# Patient Record
Sex: Female | Born: 1960 | Race: Black or African American | Hispanic: No | Marital: Single | Smoking: Never smoker
Health system: Southern US, Community
[De-identification: ages and names within clinical notes are randomized; demographics above are authoritative.]

## PROBLEM LIST (undated history)

## (undated) DIAGNOSIS — N63 Unspecified lump in unspecified breast: Secondary | ICD-10-CM

## (undated) DIAGNOSIS — A048 Other specified bacterial intestinal infections: Secondary | ICD-10-CM

## (undated) DIAGNOSIS — R87610 Atypical squamous cells of undetermined significance on cytologic smear of cervix (ASC-US): Secondary | ICD-10-CM

## (undated) DIAGNOSIS — E0590 Thyrotoxicosis, unspecified without thyrotoxic crisis or storm: Secondary | ICD-10-CM

## (undated) DIAGNOSIS — K219 Gastro-esophageal reflux disease without esophagitis: Secondary | ICD-10-CM

## (undated) DIAGNOSIS — E7800 Pure hypercholesterolemia, unspecified: Secondary | ICD-10-CM

## (undated) DIAGNOSIS — H348122 Central retinal vein occlusion, left eye, stable: Secondary | ICD-10-CM

## (undated) DIAGNOSIS — D649 Anemia, unspecified: Secondary | ICD-10-CM

## (undated) DIAGNOSIS — N951 Menopausal and female climacteric states: Secondary | ICD-10-CM

## (undated) DIAGNOSIS — E0500 Thyrotoxicosis with diffuse goiter without thyrotoxic crisis or storm: Secondary | ICD-10-CM

## (undated) HISTORY — PX: OTHER SURGICAL HISTORY: SHX169

## (undated) HISTORY — DX: Thyrotoxicosis, unspecified without thyrotoxic crisis or storm: E05.90

## (undated) HISTORY — DX: Thyrotoxicosis with diffuse goiter without thyrotoxic crisis or storm: E05.00

## (undated) HISTORY — DX: Central retinal vein occlusion, left eye, stable: H34.8122

## (undated) HISTORY — DX: Pure hypercholesterolemia, unspecified: E78.00

## (undated) HISTORY — DX: Other specified bacterial intestinal infections: A04.8

## (undated) HISTORY — DX: Anemia, unspecified: D64.9

## (undated) HISTORY — DX: Atypical squamous cells of undetermined significance on cytologic smear of cervix (ASC-US): R87.610

## (undated) HISTORY — DX: Menopausal and female climacteric states: N95.1

## (undated) HISTORY — DX: Unspecified lump in unspecified breast: N63.0

## (undated) HISTORY — DX: Gastro-esophageal reflux disease without esophagitis: K21.9

---

## 2000-01-13 ENCOUNTER — Encounter: Payer: Self-pay | Admitting: Family Medicine

## 2000-01-13 ENCOUNTER — Encounter: Admission: RE | Admit: 2000-01-13 | Discharge: 2000-01-13 | Payer: Self-pay | Admitting: Family Medicine

## 2001-09-20 ENCOUNTER — Other Ambulatory Visit: Admission: RE | Admit: 2001-09-20 | Discharge: 2001-09-20 | Payer: Self-pay | Admitting: Family Medicine

## 2002-01-31 ENCOUNTER — Encounter: Payer: Self-pay | Admitting: Family Medicine

## 2002-01-31 ENCOUNTER — Encounter: Admission: RE | Admit: 2002-01-31 | Discharge: 2002-01-31 | Payer: Self-pay | Admitting: Family Medicine

## 2003-01-13 ENCOUNTER — Encounter: Payer: Self-pay | Admitting: Family Medicine

## 2003-01-13 ENCOUNTER — Encounter: Admission: RE | Admit: 2003-01-13 | Discharge: 2003-01-13 | Payer: Self-pay | Admitting: Family Medicine

## 2003-07-10 ENCOUNTER — Encounter: Admission: RE | Admit: 2003-07-10 | Discharge: 2003-07-10 | Payer: Self-pay | Admitting: Family Medicine

## 2004-11-25 ENCOUNTER — Encounter: Admission: RE | Admit: 2004-11-25 | Discharge: 2004-11-25 | Payer: Self-pay | Admitting: Family Medicine

## 2005-01-24 ENCOUNTER — Other Ambulatory Visit: Admission: RE | Admit: 2005-01-24 | Discharge: 2005-01-24 | Payer: Self-pay | Admitting: Family Medicine

## 2007-12-19 ENCOUNTER — Other Ambulatory Visit: Admission: RE | Admit: 2007-12-19 | Discharge: 2007-12-19 | Payer: Self-pay | Admitting: Family Medicine

## 2009-02-05 ENCOUNTER — Ambulatory Visit (HOSPITAL_COMMUNITY): Admission: RE | Admit: 2009-02-05 | Discharge: 2009-02-05 | Payer: Self-pay | Admitting: Family Medicine

## 2009-06-16 ENCOUNTER — Other Ambulatory Visit: Admission: RE | Admit: 2009-06-16 | Discharge: 2009-06-16 | Payer: Self-pay | Admitting: Family Medicine

## 2009-06-23 ENCOUNTER — Encounter: Admission: RE | Admit: 2009-06-23 | Discharge: 2009-06-23 | Payer: Self-pay | Admitting: Family Medicine

## 2010-03-01 ENCOUNTER — Encounter: Admission: RE | Admit: 2010-03-01 | Discharge: 2010-03-01 | Payer: Self-pay | Admitting: Family Medicine

## 2010-04-17 ENCOUNTER — Encounter: Payer: Self-pay | Admitting: Family Medicine

## 2010-06-28 ENCOUNTER — Other Ambulatory Visit (HOSPITAL_COMMUNITY)
Admission: RE | Admit: 2010-06-28 | Discharge: 2010-06-28 | Disposition: A | Discharge status: Home / Self Care | Payer: BC Managed Care – PPO | Source: Ambulatory Visit | Attending: Family Medicine | Admitting: Family Medicine

## 2010-06-28 ENCOUNTER — Other Ambulatory Visit: Payer: Self-pay | Admitting: Family Medicine

## 2010-06-28 DIAGNOSIS — Z124 Encounter for screening for malignant neoplasm of cervix: Secondary | ICD-10-CM | POA: Insufficient documentation

## 2011-01-24 ENCOUNTER — Other Ambulatory Visit: Payer: Self-pay | Admitting: Family Medicine

## 2011-01-24 DIAGNOSIS — N6009 Solitary cyst of unspecified breast: Secondary | ICD-10-CM

## 2011-02-04 ENCOUNTER — Ambulatory Visit
Admission: RE | Admit: 2011-02-04 | Discharge: 2011-02-04 | Disposition: A | Discharge status: Home / Self Care | Payer: BC Managed Care – PPO | Source: Ambulatory Visit | Attending: Family Medicine | Admitting: Family Medicine

## 2011-02-04 ENCOUNTER — Other Ambulatory Visit: Payer: Self-pay | Admitting: Family Medicine

## 2011-02-04 DIAGNOSIS — N6009 Solitary cyst of unspecified breast: Secondary | ICD-10-CM

## 2011-03-04 ENCOUNTER — Other Ambulatory Visit: Payer: Self-pay | Admitting: Family Medicine

## 2011-03-04 ENCOUNTER — Ambulatory Visit
Admission: RE | Admit: 2011-03-04 | Discharge: 2011-03-04 | Disposition: A | Discharge status: Home / Self Care | Payer: BC Managed Care – PPO | Source: Ambulatory Visit | Attending: Family Medicine | Admitting: Family Medicine

## 2011-03-04 DIAGNOSIS — N6009 Solitary cyst of unspecified breast: Secondary | ICD-10-CM

## 2011-03-07 ENCOUNTER — Ambulatory Visit
Admission: RE | Admit: 2011-03-07 | Discharge: 2011-03-07 | Disposition: A | Discharge status: Home / Self Care | Payer: BC Managed Care – PPO | Source: Ambulatory Visit | Attending: Family Medicine | Admitting: Family Medicine

## 2011-03-07 DIAGNOSIS — N6009 Solitary cyst of unspecified breast: Secondary | ICD-10-CM

## 2012-02-01 ENCOUNTER — Other Ambulatory Visit: Payer: Self-pay | Admitting: Family Medicine

## 2012-02-01 DIAGNOSIS — Z1231 Encounter for screening mammogram for malignant neoplasm of breast: Secondary | ICD-10-CM

## 2012-03-12 ENCOUNTER — Ambulatory Visit
Admission: RE | Admit: 2012-03-12 | Discharge: 2012-03-12 | Disposition: A | Discharge status: Home / Self Care | Payer: BC Managed Care – PPO | Source: Ambulatory Visit | Attending: Family Medicine | Admitting: Family Medicine

## 2012-03-12 ENCOUNTER — Ambulatory Visit: Payer: BC Managed Care – PPO

## 2012-03-12 DIAGNOSIS — Z1231 Encounter for screening mammogram for malignant neoplasm of breast: Secondary | ICD-10-CM

## 2012-03-16 ENCOUNTER — Other Ambulatory Visit: Payer: Self-pay | Admitting: Family Medicine

## 2012-03-16 DIAGNOSIS — R928 Other abnormal and inconclusive findings on diagnostic imaging of breast: Secondary | ICD-10-CM

## 2012-03-30 ENCOUNTER — Ambulatory Visit
Admission: RE | Admit: 2012-03-30 | Discharge: 2012-03-30 | Disposition: A | Discharge status: Home / Self Care | Payer: BC Managed Care – PPO | Source: Ambulatory Visit | Attending: Family Medicine | Admitting: Family Medicine

## 2012-03-30 DIAGNOSIS — R928 Other abnormal and inconclusive findings on diagnostic imaging of breast: Secondary | ICD-10-CM

## 2013-01-24 ENCOUNTER — Other Ambulatory Visit: Payer: Self-pay

## 2013-01-24 DIAGNOSIS — Z1231 Encounter for screening mammogram for malignant neoplasm of breast: Secondary | ICD-10-CM

## 2013-03-13 ENCOUNTER — Ambulatory Visit
Admission: RE | Admit: 2013-03-13 | Discharge: 2013-03-13 | Disposition: A | Discharge status: Home / Self Care | Payer: BC Managed Care – PPO | Source: Ambulatory Visit

## 2013-03-13 DIAGNOSIS — Z1231 Encounter for screening mammogram for malignant neoplasm of breast: Secondary | ICD-10-CM

## 2013-03-20 ENCOUNTER — Other Ambulatory Visit: Payer: Self-pay | Admitting: Family Medicine

## 2013-03-20 DIAGNOSIS — R928 Other abnormal and inconclusive findings on diagnostic imaging of breast: Secondary | ICD-10-CM

## 2013-04-02 ENCOUNTER — Ambulatory Visit
Admission: RE | Admit: 2013-04-02 | Discharge: 2013-04-02 | Disposition: A | Discharge status: Home / Self Care | Payer: BC Managed Care – PPO | Source: Ambulatory Visit | Attending: Family Medicine | Admitting: Family Medicine

## 2013-04-02 DIAGNOSIS — R928 Other abnormal and inconclusive findings on diagnostic imaging of breast: Secondary | ICD-10-CM

## 2013-04-15 ENCOUNTER — Encounter (HOSPITAL_COMMUNITY): Payer: Self-pay | Admitting: Emergency Medicine

## 2013-04-15 ENCOUNTER — Emergency Department (HOSPITAL_COMMUNITY)
Admission: EM | Admit: 2013-04-15 | Discharge: 2013-04-15 | Disposition: A | Discharge status: Home / Self Care | Payer: BC Managed Care – PPO | Attending: Emergency Medicine | Admitting: Emergency Medicine

## 2013-04-15 DIAGNOSIS — Y9389 Activity, other specified: Secondary | ICD-10-CM | POA: Insufficient documentation

## 2013-04-15 DIAGNOSIS — S161XXA Strain of muscle, fascia and tendon at neck level, initial encounter: Secondary | ICD-10-CM

## 2013-04-15 DIAGNOSIS — Y92410 Unspecified street and highway as the place of occurrence of the external cause: Secondary | ICD-10-CM | POA: Insufficient documentation

## 2013-04-15 DIAGNOSIS — S139XXA Sprain of joints and ligaments of unspecified parts of neck, initial encounter: Secondary | ICD-10-CM | POA: Insufficient documentation
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
## 2013-04-15 NOTE — Discharge Instructions (Signed)
Cervical Strain and Sprain (Whiplash)  with Rehab  Cervical strain and sprains are injuries that commonly occur with "whiplash" injuries. Whiplash occurs when the neck is forcefully whipped backward or forward, such as during a motor vehicle accident. The muscles, ligaments, tendons, discs and nerves of the neck are susceptible to injury when this occurs.  SYMPTOMS   · Pain or stiffness in the front and/or back of neck  · Symptoms may present immediately or up to 24 hours after injury.  · Dizziness, headache, nausea and vomiting.  · Muscle spasm with soreness and stiffness in the neck.  · Tenderness and swelling at the injury site.  CAUSES   Whiplash injuries often occur during contact sports or motor vehicle accidents.   RISK INCREASES WITH:  · Osteoarthritis of the spine.  · Situations that make head or neck accidents or trauma more likely.  · High-risk sports (football, rugby, wrestling, hockey, auto racing, gymnastics, diving, contact karate or boxing).  · Poor strength and flexibility of the neck.  · Previous neck injury.  · Poor tackling technique.  · Improperly fitted or padded equipment.  PREVENTION  · Learn and use proper technique (avoid tackling with the head, spearing and head-butting; use proper falling techniques to avoid landing on the head).  · Warm up and stretch properly before activity.  · Maintain physical fitness:  · Strength, flexibility and endurance.  · Cardiovascular fitness.  · Wear properly fitted and padded protective equipment, such as padded soft collars, for participation in contact sports.  PROGNOSIS   Recovery for cervical strain and sprain injuries is dependent on the extent of the injury. These injuries are usually curable in 1 week to 3 months with appropriate treatment.   RELATED COMPLICATIONS   · Temporary numbness and weakness may occur if the nerve roots are damaged, and this may persist until the nerve has completely healed.  · Chronic pain due to frequent recurrence of  symptoms.  · Prolonged healing, especially if activity is resumed too soon (before complete recovery).  TREATMENT   Treatment initially involves the use of ice and medication to help reduce pain and inflammation. It is also important to perform strengthening and stretching exercises and modify activities that worsen symptoms so the injury does not get worse. These exercises may be performed at home or with a therapist. For patients who experience severe symptoms, a soft padded collar may be recommended to be worn around the neck.   Improving your posture may help reduce symptoms. Posture improvement includes pulling your chin and abdomen in while sitting or standing. If you are sitting, sit in a firm chair with your buttocks against the back of the chair. While sleeping, try replacing your pillow with a small towel rolled to 2 inches in diameter, or use a cervical pillow or soft cervical collar. Poor sleeping positions delay healing.   For patients with nerve root damage, which causes numbness or weakness, the use of a cervical traction apparatus may be recommended. Surgery is rarely necessary for these injuries. However, cervical strain and sprains that are present at birth (congenital) may require surgery.  MEDICATION   · If pain medication is necessary, nonsteroidal anti-inflammatory medications, such as aspirin and ibuprofen, or other minor pain relievers, such as acetaminophen, are often recommended.  · Do not take pain medication for 7 days before surgery.  · Prescription pain relievers may be given if deemed necessary by your caregiver. Use only as directed and only as much as you   need.  HEAT AND COLD:   · Cold treatment (icing) relieves pain and reduces inflammation. Cold treatment should be applied for 10 to 15 minutes every 2 to 3 hours for inflammation and pain and immediately after any activity that aggravates your symptoms. Use ice packs or an ice massage.  · Heat treatment may be used prior to  performing the stretching and strengthening activities prescribed by your caregiver, physical therapist, or athletic trainer. Use a heat pack or a warm soak.  SEEK MEDICAL CARE IF:   · Symptoms get worse or do not improve in 2 weeks despite treatment.  · New, unexplained symptoms develop (drugs used in treatment may produce side effects).  EXERCISES  RANGE OF MOTION (ROM) AND STRETCHING EXERCISES - Cervical Strain and Sprain  These exercises may help you when beginning to rehabilitate your injury. In order to successfully resolve your symptoms, you must improve your posture. These exercises are designed to help reduce the forward-head and rounded-shoulder posture which contributes to this condition. Your symptoms may resolve with or without further involvement from your physician, physical therapist or athletic trainer. While completing these exercises, remember:   · Restoring tissue flexibility helps normal motion to return to the joints. This allows healthier, less painful movement and activity.  · An effective stretch should be held for at least 20 seconds, although you may need to begin with shorter hold times for comfort.  · A stretch should never be painful. You should only feel a gentle lengthening or release in the stretched tissue.  STRETCH- Axial Extensors  · Lie on your back on the floor. You may bend your knees for comfort. Place a rolled up hand towel or dish towel, about 2 inches in diameter, under the part of your head that makes contact with the floor.  · Gently tuck your chin, as if trying to make a "double chin," until you feel a gentle stretch at the base of your head.  · Hold __________ seconds.  Repeat __________ times. Complete this exercise __________ times per day.   STRETECH - Axial Extension   · Stand or sit on a firm surface. Assume a good posture: chest up, shoulders drawn back, abdominal muscles slightly tense, knees unlocked (if standing) and feet hip width apart.  · Slowly retract your  chin so your head slides back and your chin slightly lowers.Continue to look straight ahead.  · You should feel a gentle stretch in the back of your head. Be certain not to feel an aggressive stretch since this can cause headaches later.  · Hold for __________ seconds.  Repeat __________ times. Complete this exercise __________ times per day.  STRETCH  Cervical Side Bend   · Stand or sit on a firm surface. Assume a good posture: chest up, shoulders drawn back, abdominal muscles slightly tense, knees unlocked (if standing) and feet hip width apart.  · Without letting your nose or shoulders move, slowly tip your right / left ear to your shoulder until your feel a gentle stretch in the muscles on the opposite side of your neck.  · Hold __________ seconds.  Repeat __________ times. Complete this exercise __________ times per day.  STRETCH  Cervical Rotators   · Stand or sit on a firm surface. Assume a good posture: chest up, shoulders drawn back, abdominal muscles slightly tense, knees unlocked (if standing) and feet hip width apart.  · Keeping your eyes level with the ground, slowly turn your head until you feel a gentle   stretch along the back and opposite side of your neck.  · Hold __________ seconds.  Repeat __________ times. Complete this exercise __________ times per day.  RANGE OF MOTION - Neck Circles   · Stand or sit on a firm surface. Assume a good posture: chest up, shoulders drawn back, abdominal muscles slightly tense, knees unlocked (if standing) and feet hip width apart.  · Gently roll your head down and around from the back of one shoulder to the back of the other. The motion should never be forced or painful.  · Repeat the motion 10-20 times, or until you feel the neck muscles relax and loosen.  Repeat __________ times. Complete the exercise __________ times per day.  STRENGTHENING EXERCISES - Cervical Strain and Sprain  These exercises may help you when beginning to rehabilitate your injury. They may  resolve your symptoms with or without further involvement from your physician, physical therapist or athletic trainer. While completing these exercises, remember:   · Muscles can gain both the endurance and the strength needed for everyday activities through controlled exercises.  · Complete these exercises as instructed by your physician, physical therapist or athletic trainer. Progress the resistance and repetitions only as guided.  · You may experience muscle soreness or fatigue, but the pain or discomfort you are trying to eliminate should never worsen during these exercises. If this pain does worsen, stop and make certain you are following the directions exactly. If the pain is still present after adjustments, discontinue the exercise until you can discuss the trouble with your clinician.  STRENGTH Cervical Flexors, Isometric  · Face a wall, standing about 6 inches away. Place a small pillow, a ball about 6-8 inches in diameter, or a folded towel between your forehead and the wall.  · Slightly tuck your chin and gently push your forehead into the soft object. Push only with mild to moderate intensity, building up tension gradually. Keep your jaw and forehead relaxed.  · Hold 10 to 20 seconds. Keep your breathing relaxed.  · Release the tension slowly. Relax your neck muscles completely before you start the next repetition.  Repeat __________ times. Complete this exercise __________ times per day.  STRENGTH- Cervical Lateral Flexors, Isometric   · Stand about 6 inches away from a wall. Place a small pillow, a ball about 6-8 inches in diameter, or a folded towel between the side of your head and the wall.  · Slightly tuck your chin and gently tilt your head into the soft object. Push only with mild to moderate intensity, building up tension gradually. Keep your jaw and forehead relaxed.  · Hold 10 to 20 seconds. Keep your breathing relaxed.  · Release the tension slowly. Relax your neck muscles completely before  you start the next repetition.  Repeat __________ times. Complete this exercise __________ times per day.  STRENGTH  Cervical Extensors, Isometric   · Stand about 6 inches away from a wall. Place a small pillow, a ball about 6-8 inches in diameter, or a folded towel between the back of your head and the wall.  · Slightly tuck your chin and gently tilt your head back into the soft object. Push only with mild to moderate intensity, building up tension gradually. Keep your jaw and forehead relaxed.  · Hold 10 to 20 seconds. Keep your breathing relaxed.  · Release the tension slowly. Relax your neck muscles completely before you start the next repetition.  Repeat __________ times. Complete this exercise __________ times per   day.  POSTURE AND BODY MECHANICS CONSIDERATIONS - Cervical Strain and Sprain  Keeping correct posture when sitting, standing or completing your activities will reduce the stress put on different body tissues, allowing injured tissues a chance to heal and limiting painful experiences. The following are general guidelines for improved posture. Your physician or physical therapist will provide you with any instructions specific to your needs. While reading these guidelines, remember:  · The exercises prescribed by your provider will help you have the flexibility and strength to maintain correct postures.  · The correct posture provides the optimal environment for your joints to work. All of your joints have less wear and tear when properly supported by a spine with good posture. This means you will experience a healthier, less painful body.  · Correct posture must be practiced with all of your activities, especially prolonged sitting and standing. Correct posture is as important when doing repetitive low-stress activities (typing) as it is when doing a single heavy-load activity (lifting).  PROLONGED STANDING WHILE SLIGHTLY LEANING FORWARD  When completing a task that requires you to lean forward while  standing in one place for a long time, place either foot up on a stationary 2-4 inch high object to help maintain the best posture. When both feet are on the ground, the low back tends to lose its slight inward curve. If this curve flattens (or becomes too large), then the back and your other joints will experience too much stress, fatigue more quickly and can cause pain.   RESTING POSITIONS  Consider which positions are most painful for you when choosing a resting position. If you have pain with flexion-based activities (sitting, bending, stooping, squatting), choose a position that allows you to rest in a less flexed posture. You would want to avoid curling into a fetal position on your side. If your pain worsens with extension-based activities (prolonged standing, working overhead), avoid resting in an extended position such as sleeping on your stomach. Most people will find more comfort when they rest with their spine in a more neutral position, neither too rounded nor too arched. Lying on a non-sagging bed on your side with a pillow between your knees, or on your back with a pillow under your knees will often provide some relief. Keep in mind, being in any one position for a prolonged period of time, no matter how correct your posture, can still lead to stiffness.  WALKING  Walk with an upright posture. Your ears, shoulders and hips should all line-up.  OFFICE WORK  When working at a desk, create an environment that supports good, upright posture. Without extra support, muscles fatigue and lead to excessive strain on joints and other tissues.  CHAIR:  · A chair should be able to slide under your desk when your back makes contact with the back of the chair. This allows you to work closely.  · The chair's height should allow your eyes to be level with the upper part of your monitor and your hands to be slightly lower than your elbows.  · Body position:  · Your feet should make contact with the floor. If this is  not possible, use a foot rest.  · Keep your ears over your shoulders. This will reduce stress on your neck and low back.  Document Released: 03/14/2005 Document Revised: 07/09/2012 Document Reviewed: 06/26/2008  [employer]® Patient Information ©2014 [employer].

## 2013-04-17 NOTE — ED Provider Notes (Signed)
Medical screening examination/treatment/procedure(s) were performed by non-physician practitioner and as supervising physician I was immediately available for consultation/collaboration.  EKG Interpretation   None        Kahle Mcqueen R. Iram Astorino, MD 04/17/13 1456 

## 2013-07-26 ENCOUNTER — Other Ambulatory Visit (HOSPITAL_COMMUNITY)
Admission: RE | Admit: 2013-07-26 | Discharge: 2013-07-26 | Disposition: A | Discharge status: Home / Self Care | Payer: BC Managed Care – PPO | Source: Ambulatory Visit | Attending: Family Medicine | Admitting: Family Medicine

## 2013-07-26 ENCOUNTER — Other Ambulatory Visit: Payer: Self-pay | Admitting: Family Medicine

## 2013-07-26 DIAGNOSIS — Z124 Encounter for screening for malignant neoplasm of cervix: Secondary | ICD-10-CM | POA: Insufficient documentation

## 2013-08-27 ENCOUNTER — Other Ambulatory Visit: Payer: Self-pay | Admitting: Family Medicine

## 2013-08-27 DIAGNOSIS — N6009 Solitary cyst of unspecified breast: Secondary | ICD-10-CM

## 2013-10-04 ENCOUNTER — Ambulatory Visit
Admission: RE | Admit: 2013-10-04 | Discharge: 2013-10-04 | Disposition: A | Discharge status: Home / Self Care | Payer: BC Managed Care – PPO | Source: Ambulatory Visit | Attending: Family Medicine | Admitting: Family Medicine

## 2013-10-04 ENCOUNTER — Encounter (INDEPENDENT_AMBULATORY_CARE_PROVIDER_SITE_OTHER): Payer: Self-pay

## 2013-10-04 DIAGNOSIS — N6009 Solitary cyst of unspecified breast: Secondary | ICD-10-CM

## 2013-11-21 ENCOUNTER — Other Ambulatory Visit: Payer: Self-pay | Admitting: Family Medicine

## 2013-11-21 DIAGNOSIS — N63 Unspecified lump in unspecified breast: Secondary | ICD-10-CM

## 2013-11-25 ENCOUNTER — Ambulatory Visit
Admission: RE | Admit: 2013-11-25 | Discharge: 2013-11-25 | Disposition: A | Discharge status: Home / Self Care | Payer: BC Managed Care – PPO | Source: Ambulatory Visit | Attending: Family Medicine | Admitting: Family Medicine

## 2013-11-25 ENCOUNTER — Encounter (INDEPENDENT_AMBULATORY_CARE_PROVIDER_SITE_OTHER): Payer: Self-pay

## 2013-11-25 DIAGNOSIS — N63 Unspecified lump in unspecified breast: Secondary | ICD-10-CM

## 2014-03-04 ENCOUNTER — Other Ambulatory Visit: Payer: Self-pay | Admitting: Family Medicine

## 2014-03-04 DIAGNOSIS — N6002 Solitary cyst of left breast: Secondary | ICD-10-CM

## 2014-03-17 ENCOUNTER — Ambulatory Visit
Admission: RE | Admit: 2014-03-17 | Discharge: 2014-03-17 | Disposition: A | Discharge status: Home / Self Care | Payer: BC Managed Care – PPO | Source: Ambulatory Visit | Attending: Family Medicine | Admitting: Family Medicine

## 2014-03-17 DIAGNOSIS — N6002 Solitary cyst of left breast: Secondary | ICD-10-CM

## 2014-12-10 ENCOUNTER — Other Ambulatory Visit: Payer: Self-pay

## 2014-12-10 DIAGNOSIS — Z1231 Encounter for screening mammogram for malignant neoplasm of breast: Secondary | ICD-10-CM

## 2015-03-19 ENCOUNTER — Ambulatory Visit
Admission: RE | Admit: 2015-03-19 | Discharge: 2015-03-19 | Disposition: A | Discharge status: Home / Self Care | Payer: BC Managed Care – PPO | Source: Ambulatory Visit

## 2015-03-19 DIAGNOSIS — Z1231 Encounter for screening mammogram for malignant neoplasm of breast: Secondary | ICD-10-CM

## 2016-02-16 ENCOUNTER — Other Ambulatory Visit: Payer: Self-pay | Admitting: Family Medicine

## 2016-02-16 DIAGNOSIS — Z1231 Encounter for screening mammogram for malignant neoplasm of breast: Secondary | ICD-10-CM

## 2016-03-23 ENCOUNTER — Ambulatory Visit
Admission: RE | Admit: 2016-03-23 | Discharge: 2016-03-23 | Disposition: A | Discharge status: Home / Self Care | Payer: BC Managed Care – PPO | Source: Ambulatory Visit | Attending: Family Medicine | Admitting: Family Medicine

## 2016-03-23 DIAGNOSIS — Z1231 Encounter for screening mammogram for malignant neoplasm of breast: Secondary | ICD-10-CM

## 2016-08-09 ENCOUNTER — Other Ambulatory Visit: Payer: Self-pay | Admitting: Family Medicine

## 2016-08-09 ENCOUNTER — Other Ambulatory Visit (HOSPITAL_COMMUNITY)
Admission: RE | Admit: 2016-08-09 | Discharge: 2016-08-09 | Disposition: A | Discharge status: Home / Self Care | Payer: BC Managed Care – PPO | Source: Ambulatory Visit | Attending: Family Medicine | Admitting: Family Medicine

## 2016-08-09 DIAGNOSIS — Z124 Encounter for screening for malignant neoplasm of cervix: Secondary | ICD-10-CM | POA: Insufficient documentation

## 2016-08-15 LAB — CYTOLOGY - PAP
Diagnosis: UNDETERMINED — AB
HPV: NOT DETECTED

## 2016-08-25 ENCOUNTER — Other Ambulatory Visit (HOSPITAL_COMMUNITY): Payer: Self-pay | Admitting: Family Medicine

## 2016-08-25 DIAGNOSIS — E0590 Thyrotoxicosis, unspecified without thyrotoxic crisis or storm: Secondary | ICD-10-CM

## 2016-09-06 ENCOUNTER — Encounter (HOSPITAL_COMMUNITY): Payer: BC Managed Care – PPO

## 2016-09-07 ENCOUNTER — Encounter (HOSPITAL_COMMUNITY): Payer: BC Managed Care – PPO

## 2016-09-13 ENCOUNTER — Ambulatory Visit (HOSPITAL_COMMUNITY)
Admission: RE | Admit: 2016-09-13 | Discharge: 2016-09-13 | Disposition: A | Discharge status: Home / Self Care | Payer: BC Managed Care – PPO | Source: Ambulatory Visit | Attending: Family Medicine | Admitting: Family Medicine

## 2016-09-13 DIAGNOSIS — E0590 Thyrotoxicosis, unspecified without thyrotoxic crisis or storm: Secondary | ICD-10-CM | POA: Insufficient documentation

## 2016-09-13 MED ORDER — SODIUM IODIDE I 131 CAPSULE
10.0000 | Freq: Once | INTRAVENOUS | Status: AC | PRN
  Administered 2016-09-13: 10 via ORAL

## 2016-09-14 ENCOUNTER — Encounter (HOSPITAL_COMMUNITY)
Admission: RE | Admit: 2016-09-14 | Discharge: 2016-09-14 | Disposition: A | Discharge status: Home / Self Care | Payer: BC Managed Care – PPO | Source: Ambulatory Visit | Attending: Family Medicine | Admitting: Family Medicine

## 2016-09-14 DIAGNOSIS — E0590 Thyrotoxicosis, unspecified without thyrotoxic crisis or storm: Secondary | ICD-10-CM | POA: Diagnosis present

## 2016-09-14 MED ORDER — SODIUM PERTECHNETATE TC 99M INJECTION
10.0000 | Freq: Once | INTRAVENOUS | Status: AC | PRN
  Administered 2016-09-14: 10 via INTRAVENOUS

## 2017-02-10 ENCOUNTER — Other Ambulatory Visit: Payer: Self-pay | Admitting: Family Medicine

## 2017-02-10 DIAGNOSIS — Z1231 Encounter for screening mammogram for malignant neoplasm of breast: Secondary | ICD-10-CM

## 2017-03-27 ENCOUNTER — Ambulatory Visit
Admission: RE | Admit: 2017-03-27 | Discharge: 2017-03-27 | Disposition: A | Discharge status: Home / Self Care | Payer: BC Managed Care – PPO | Source: Ambulatory Visit | Attending: Family Medicine | Admitting: Family Medicine

## 2017-03-27 DIAGNOSIS — Z1231 Encounter for screening mammogram for malignant neoplasm of breast: Secondary | ICD-10-CM

## 2017-08-10 ENCOUNTER — Other Ambulatory Visit: Payer: Self-pay | Admitting: Family Medicine

## 2017-08-15 LAB — CYTOLOGY - PAP
Adequacy: ABSENT
Diagnosis: NEGATIVE

## 2017-10-20 DIAGNOSIS — E7800 Pure hypercholesterolemia, unspecified: Secondary | ICD-10-CM | POA: Insufficient documentation

## 2017-11-13 NOTE — Progress Notes (Signed)
Cardiology Office Note:    Date:  11/14/2017   ID:  Colleen Wood, DOB [birth year], MRN [id number]  PCP:  Colleen Small, MD  Cardiologist:  No primary care provider on file.   Referring MD: Colleen Small, MD   Chief Complaint  Patient presents with  . Advice Only    Risk modification    History of Present Illness:    Colleen Wood is a 57 y.o. female with a hx of hyperlipidemia and family history of hypertension and stroke.  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  She had an episode of persistent tachycardia earlier this year to lead to evaluation and eventual diagnosis of hyperthyroidism.  She routinely runs for exercise.  She has been physically active throughout her entire life.  She has some difficulty with diet control.  During hyperthyroidism she lost greater than 20 pounds.  On methimazole she has improved and increase weight has been noted.  She denies chest pain, orthopnea, PND, lower extremity swelling, symptomatic palpitations, and limitations in exertional tolerance with activity.  She has no documented prior history of heart disease.  Rapid heart rate that she had at the beginning of her hyperthyroidism diagnosis is presumed to be sinus tachycardia.    Past Medical History:  Diagnosis Date  . Hypercholesteremia     History reviewed. No pertinent surgical history.  Current Medications: Current Meds  Medication Sig  . ibuprofen (ADVIL,MOTRIN) 200 MG tablet Take 200 mg by mouth every 6 (six) hours as needed (pain).     Allergies:   Patient has no known allergies.   Social History   Socioeconomic History  . Marital status: Single    Spouse name: Not on file  . Number of children: Not on file  . Years of education: Not on file  . Highest education level: Not on file  Occupational History  . Not on file  Social Needs  . Financial resource strain: Not on file  . Food insecurity:    Worry: Not on file   Inability: Not on file  . Transportation needs:    Medical: Not on file    Non-medical: Not on file  Tobacco Use  . Smoking status: Never Smoker  . Smokeless tobacco: Never Used  Substance and Sexual Activity  . Alcohol use: No  . Drug use: No  . Sexual activity: Not on file  Lifestyle  . Physical activity:    Days per week: Not on file    Minutes per session: Not on file  . Stress: Not on file  Relationships  . Social connections:    Talks on phone: Not on file    Gets together: Not on file    Attends religious service: Not on file    Active member of club or organization: Not on file    Attends meetings of clubs or organizations: Not on file    Relationship status: Not on file  Other Topics Concern  . Not on file  Social History Narrative  . Not on file     Family History: The patient's family history includes Cancer in her other; Diabetes in her mother and other; Hypertension in her other; Stroke in her father and other.  ROS:   Please see the history of present illness.    No significant other medical complaints.  Thyroid disease in the form of Graves'.  All other systems reviewed and are negative.  EKGs/Labs/Other Studies Reviewed:    The  following studies were reviewed today: No data as most of her information is in [facility] [place].  EKG:  EKG is  ordered today.  The ekg ordered today demonstrates normal sinus rhythm with normal appearance.  Recent Labs: No results found for requested labs within last 8760 hours.  Recent Lipid Panel No results found for: CHOL, TRIG, HDL, CHOLHDL, VLDL, LDLCALC, LDLDIRECT  Physical Exam:    VS:  BP (!) 140/92   Pulse 74   Ht 5' 8.5" (1.74 m)   Wt 186 lb 3.2 oz (84.5 kg)   BMI 27.90 kg/m     Wt Readings from Last 3 Encounters:  11/14/17 186 lb 3.2 oz (84.5 kg)  04/15/13 184 lb 4 oz (83.6 kg)     GEN:  Well nourished, well developed in no acute distress HEENT: Normal NECK: No JVD. LYMPHATICS: No  lymphadenopathy CARDIAC: RRR, no murmur, no gallop, no edema. VASCULAR: 2+ and symmetric radial pulses.  No bruits. RESPIRATORY:  Clear to auscultation without rales, wheezing or rhonchi  ABDOMEN: Soft, non-tender, non-distended, No pulsatile mass, MUSCULOSKELETAL: No deformity  SKIN: Warm and dry NEUROLOGIC:  Alert and oriented x 3 PSYCHIATRIC:  Normal affect   ASSESSMENT:    1. Hypercholesteremia   2. Family history of early CAD   3. Sinus tachycardia    PLAN:    In order of problems listed above:  1. Over the long haul LDL cholesterol should be less than 100.  She is close now without medical therapy.  I encouraged diet including more plant-based components, decrease carbohydrate intake, and lean meat.  Aerobic activity up to 150 minutes of moderate exertion per week, and reduction of salt in her diet. 2. Elevated blood pressure without diagnosis of hypertension.  Target/goal blood pressure 130/80 mmHg.  I recommended that she purchase a blood pressure monitor and record at least 4 times per month.  If she consistently runs greater than 140/90 mmHg therapy should be considered.  Low-salt diet, weight loss, and continued aerobic activity or important. 3. Was present when work-up for hyperthyroidism began.  Once hyperthyroidism identified and treated, sinus tachycardia has resolved.  Given her level of fitness, asymptomatic status, normal EKG and exam I do not feel any particular evaluation is necessary.  We did discuss the virtues of primary prevention versus secondary prevention.    Medication Adjustments/Labs and Tests Ordered: Current medicines are reviewed at length with the patient today.  Concerns regarding medicines are outlined above.  Orders Placed This Encounter  Procedures  . EKG 12-Lead   No orders of the defined types were placed in this encounter.   Patient Instructions  Medication Instructions:  Your physician recommends that you continue on your current  medications as directed. Please refer to the Current Medication list given to you today.  Labwork: None  Testing/Procedures: None  Follow-Up: Your physician recommends that you schedule a follow-up appointment as needed with Dr. Katrinka BlazingSmith.    Any Other Special Instructions Will Be Listed Below (If Applicable).  Dr. Katrinka BlazingSmith would like for you to monitor your blood pressure at least 4 times a month.  We would like for your blood pressure to be 130/80 or less.  If consistently higher than 140/90, please contact our office for blood pressure control.    Your LDL (bad) cholesterol should be 100 or less.  Your physician discussed the importance of regular exercise and recommended that you start or continue a regular exercise program for good health.    If you  need a refill on your cardiac medications before your next appointment, please call your pharmacy.      Signed, Lesleigh NoeHenry W Lauris Keepers III, MD  11/14/2017 5:59 PM    [facility]

## 2017-11-14 ENCOUNTER — Ambulatory Visit (INDEPENDENT_AMBULATORY_CARE_PROVIDER_SITE_OTHER): Payer: BC Managed Care – PPO | Admitting: Interventional Cardiology

## 2017-11-14 ENCOUNTER — Encounter: Payer: Self-pay | Admitting: Interventional Cardiology

## 2017-11-14 ENCOUNTER — Encounter (INDEPENDENT_AMBULATORY_CARE_PROVIDER_SITE_OTHER): Payer: Self-pay

## 2017-11-14 VITALS — BP 140/92 | HR 74 | Ht 68.5 in | Wt 186.2 lb

## 2017-11-14 DIAGNOSIS — R000 Tachycardia, unspecified: Secondary | ICD-10-CM | POA: Diagnosis not present

## 2017-11-14 DIAGNOSIS — Z8249 Family history of ischemic heart disease and other diseases of the circulatory system: Secondary | ICD-10-CM

## 2017-11-14 DIAGNOSIS — E7800 Pure hypercholesterolemia, unspecified: Secondary | ICD-10-CM

## 2017-11-14 NOTE — Patient Instructions (Signed)
Medication Instructions:  Your physician recommends that you continue on your current medications as directed. Please refer to the Current Medication list given to you today.  Labwork: None  Testing/Procedures: None  Follow-Up: Your physician recommends that you schedule a follow-up appointment as needed with Dr. Katrinka BlazingSmith.    Any Other Special Instructions Will Be Listed Below (If Applicable).  Dr. Katrinka BlazingSmith would like for you to monitor your blood pressure at least 4 times a month.  We would like for your blood pressure to be 130/80 or less.  If consistently higher than 140/90, please contact our office for blood pressure control.    Your LDL (bad) cholesterol should be 100 or less.  Your physician discussed the importance of regular exercise and recommended that you start or continue a regular exercise program for good health.    If you need a refill on your cardiac medications before your next appointment, please call your pharmacy.

## 2018-02-28 ENCOUNTER — Other Ambulatory Visit: Payer: Self-pay | Admitting: Family Medicine

## 2018-02-28 DIAGNOSIS — Z1231 Encounter for screening mammogram for malignant neoplasm of breast: Secondary | ICD-10-CM

## 2018-04-06 ENCOUNTER — Ambulatory Visit
Admission: RE | Admit: 2018-04-06 | Discharge: 2018-04-06 | Disposition: A | Discharge status: Home / Self Care | Payer: BC Managed Care – PPO | Source: Ambulatory Visit | Attending: Family Medicine | Admitting: Family Medicine

## 2018-04-06 DIAGNOSIS — Z1231 Encounter for screening mammogram for malignant neoplasm of breast: Secondary | ICD-10-CM

## 2018-04-09 ENCOUNTER — Other Ambulatory Visit: Payer: Self-pay | Admitting: Family Medicine

## 2018-04-09 DIAGNOSIS — R928 Other abnormal and inconclusive findings on diagnostic imaging of breast: Secondary | ICD-10-CM

## 2018-04-11 ENCOUNTER — Ambulatory Visit
Admission: RE | Admit: 2018-04-11 | Discharge: 2018-04-11 | Disposition: A | Discharge status: Home / Self Care | Payer: BC Managed Care – PPO | Source: Ambulatory Visit | Attending: Family Medicine | Admitting: Family Medicine

## 2018-04-11 DIAGNOSIS — R928 Other abnormal and inconclusive findings on diagnostic imaging of breast: Secondary | ICD-10-CM

## 2018-09-18 ENCOUNTER — Other Ambulatory Visit: Payer: Self-pay | Admitting: Internal Medicine

## 2018-09-18 DIAGNOSIS — Z20822 Contact with and (suspected) exposure to covid-19: Secondary | ICD-10-CM

## 2018-09-21 LAB — NOVEL CORONAVIRUS, NAA: SARS-CoV-2, NAA: NOT DETECTED

## 2019-03-01 ENCOUNTER — Other Ambulatory Visit: Payer: Self-pay | Admitting: Family Medicine

## 2019-03-01 DIAGNOSIS — Z1231 Encounter for screening mammogram for malignant neoplasm of breast: Secondary | ICD-10-CM

## 2019-04-22 ENCOUNTER — Ambulatory Visit
Admission: RE | Admit: 2019-04-22 | Discharge: 2019-04-22 | Disposition: A | Discharge status: Home / Self Care | Payer: BC Managed Care – PPO | Source: Ambulatory Visit | Attending: Family Medicine | Admitting: Family Medicine

## 2019-04-22 ENCOUNTER — Other Ambulatory Visit: Payer: Self-pay

## 2019-04-22 DIAGNOSIS — Z1231 Encounter for screening mammogram for malignant neoplasm of breast: Secondary | ICD-10-CM

## 2019-04-24 ENCOUNTER — Other Ambulatory Visit: Payer: Self-pay | Admitting: Family Medicine

## 2019-04-24 DIAGNOSIS — N6320 Unspecified lump in the left breast, unspecified quadrant: Secondary | ICD-10-CM

## 2019-05-06 ENCOUNTER — Ambulatory Visit
Admission: RE | Admit: 2019-05-06 | Discharge: 2019-05-06 | Disposition: A | Discharge status: Home / Self Care | Payer: BC Managed Care – PPO | Source: Ambulatory Visit | Attending: Family Medicine | Admitting: Family Medicine

## 2019-05-06 ENCOUNTER — Other Ambulatory Visit: Payer: Self-pay

## 2019-05-06 DIAGNOSIS — N6320 Unspecified lump in the left breast, unspecified quadrant: Secondary | ICD-10-CM

## 2019-05-23 ENCOUNTER — Ambulatory Visit: Payer: BC Managed Care – PPO | Attending: Internal Medicine

## 2019-05-23 DIAGNOSIS — Z23 Encounter for immunization: Secondary | ICD-10-CM | POA: Insufficient documentation

## 2019-05-23 NOTE — Progress Notes (Signed)
   Covid-19 Vaccination Clinic  Name:  MARGUITA VENNING    MRN: [id number] DOB: 04-09-1960  05/23/2019  Ms. Cappiello was observed post Covid-19 immunization for 15 minutes without incidence. She was provided with Vaccine Information Sheet and instruction to access the V-Safe system.   Ms. Funderburg was instructed to call 911 with any severe reactions post vaccine: Marland Kitchen Difficulty breathing  . Swelling of your face and throat  . A fast heartbeat  . A bad rash all over your body  . Dizziness and weakness    Immunizations Administered    Name Date Dose VIS Date Route   Pfizer COVID-19 Vaccine 05/23/2019 11:45 AM 0.3 mL 03/08/2019 Intramuscular   Manufacturer: [employer], [employer]   Lot: [id number]   NDC: [address]-7

## 2019-05-25 ENCOUNTER — Ambulatory Visit: Payer: BC Managed Care – PPO

## 2019-06-12 ENCOUNTER — Ambulatory Visit: Payer: BC Managed Care – PPO | Attending: Internal Medicine

## 2019-06-12 DIAGNOSIS — Z23 Encounter for immunization: Secondary | ICD-10-CM

## 2019-06-12 NOTE — Progress Notes (Signed)
   Covid-19 Vaccination Clinic  Name:  VASHON ARCH    MRN: [id number] DOB: 07-16-1960  06/12/2019  Ms. Wojdyla was observed post Covid-19 immunization for 15 minutes without incident. She was provided with Vaccine Information Sheet and instruction to access the V-Safe system.   Ms. Lamson was instructed to call 911 with any severe reactions post vaccine: Marland Kitchen Difficulty breathing  . Swelling of face and throat  . A fast heartbeat  . A bad rash all over body  . Dizziness and weakness   Immunizations Administered    Name Date Dose VIS Date Route   Pfizer COVID-19 Vaccine 06/12/2019 12:46 PM 0.3 mL 03/08/2019 Intramuscular   Manufacturer: [employer], [employer]   Lot: [id number]   NDC: [address]-3

## 2019-12-17 ENCOUNTER — Other Ambulatory Visit: Payer: BC Managed Care – PPO

## 2020-01-07 ENCOUNTER — Ambulatory Visit: Payer: BC Managed Care – PPO | Attending: Critical Care Medicine

## 2020-01-07 DIAGNOSIS — Z23 Encounter for immunization: Secondary | ICD-10-CM

## 2020-01-07 NOTE — Progress Notes (Signed)
   Covid-19 Vaccination Clinic  Name:  Colleen Wood    MRN: [id number] DOB: Dec 08, 1960  01/07/2020  Ms. Colleen Wood was observed post Covid-19 immunization for 15 minutes without incident. She was provided with Vaccine Information Sheet and instruction to access the V-Safe system.   Ms. Colleen Wood was instructed to call 911 with any severe reactions post vaccine: Marland Kitchen Difficulty breathing  . Swelling of face and throat  . A fast heartbeat  . A bad rash all over body  . Dizziness and weakness

## 2020-03-05 ENCOUNTER — Other Ambulatory Visit: Payer: Self-pay | Admitting: Family Medicine

## 2020-03-05 ENCOUNTER — Other Ambulatory Visit: Payer: Self-pay

## 2020-03-05 DIAGNOSIS — N6320 Unspecified lump in the left breast, unspecified quadrant: Secondary | ICD-10-CM

## 2020-04-09 ENCOUNTER — Ambulatory Visit
Admission: RE | Admit: 2020-04-09 | Discharge: 2020-04-09 | Disposition: A | Discharge status: Home / Self Care | Payer: BC Managed Care – PPO | Source: Ambulatory Visit | Attending: Family Medicine | Admitting: Family Medicine

## 2020-04-09 ENCOUNTER — Other Ambulatory Visit: Payer: Self-pay

## 2020-04-09 DIAGNOSIS — N6320 Unspecified lump in the left breast, unspecified quadrant: Secondary | ICD-10-CM

## 2020-04-10 ENCOUNTER — Other Ambulatory Visit: Payer: Self-pay | Admitting: Family Medicine

## 2020-04-10 DIAGNOSIS — Z1231 Encounter for screening mammogram for malignant neoplasm of breast: Secondary | ICD-10-CM

## 2020-05-25 ENCOUNTER — Ambulatory Visit
Admission: RE | Admit: 2020-05-25 | Discharge: 2020-05-25 | Disposition: A | Discharge status: Home / Self Care | Payer: BC Managed Care – PPO | Source: Ambulatory Visit | Attending: Family Medicine | Admitting: Family Medicine

## 2020-05-25 ENCOUNTER — Other Ambulatory Visit: Payer: Self-pay

## 2020-05-25 DIAGNOSIS — Z1231 Encounter for screening mammogram for malignant neoplasm of breast: Secondary | ICD-10-CM

## 2020-12-30 ENCOUNTER — Other Ambulatory Visit: Payer: Self-pay | Admitting: Gastroenterology

## 2020-12-30 DIAGNOSIS — K219 Gastro-esophageal reflux disease without esophagitis: Secondary | ICD-10-CM

## 2021-01-06 ENCOUNTER — Ambulatory Visit
Admission: RE | Admit: 2021-01-06 | Discharge: 2021-01-06 | Disposition: A | Discharge status: Home / Self Care | Payer: BC Managed Care – PPO | Source: Ambulatory Visit | Attending: Gastroenterology | Admitting: Gastroenterology

## 2021-01-06 ENCOUNTER — Other Ambulatory Visit: Payer: Self-pay | Admitting: Gastroenterology

## 2021-01-06 DIAGNOSIS — K219 Gastro-esophageal reflux disease without esophagitis: Secondary | ICD-10-CM

## 2021-03-15 ENCOUNTER — Other Ambulatory Visit: Payer: Self-pay

## 2021-03-15 ENCOUNTER — Ambulatory Visit: Payer: BC Managed Care – PPO | Admitting: Interventional Cardiology

## 2021-03-15 ENCOUNTER — Encounter: Payer: Self-pay | Admitting: Interventional Cardiology

## 2021-03-15 VITALS — BP 118/72 | HR 78 | Ht 69.0 in | Wt 186.2 lb

## 2021-03-15 DIAGNOSIS — R12 Heartburn: Secondary | ICD-10-CM

## 2021-03-15 DIAGNOSIS — Z8249 Family history of ischemic heart disease and other diseases of the circulatory system: Secondary | ICD-10-CM | POA: Diagnosis not present

## 2021-03-15 DIAGNOSIS — E7800 Pure hypercholesterolemia, unspecified: Secondary | ICD-10-CM | POA: Diagnosis not present

## 2021-03-15 DIAGNOSIS — R002 Palpitations: Secondary | ICD-10-CM | POA: Diagnosis not present

## 2021-03-15 NOTE — Progress Notes (Signed)
Cardiology Office Note:    Date:  03/15/2021   ID:  Colleen Wood, DOB [birth year], MRN [id number]  PCP:  Maurice Small, MD  Cardiologist:  None   Referring MD: Maurice Small, MD   Chief Complaint  Patient presents with   Congestive Heart Failure    History of Present Illness:    Colleen Wood is a 60 y.o. female with a hx of  hyperlipidemia, family history of hypertension, and gastroesophageal reflux disease.  Recent palpitations which have subsequently resolved.  She was seen in 2019 because of concern about elevated cholesterol and possible vascular disease.  She has been troubled by reflux/heartburn but cannot really characterize the discomfort.  States that it occurs mostly after she eats.  She is active and it is not incited by physical activity.  She has no discomfort today.  In the interval since I saw her 3 years ago her 38 year old sister has undergone coronary bypass surgery.  His sister does smoke cigarettes.  [name] recent lipid profile revealed an LDL of 188 total cholesterol of 241.  Past Medical History:  Diagnosis Date   Anemia    Breast mass    Central retinal vein occlusion of left eye    GERD (gastroesophageal reflux disease)    Graves disease    Helicobacter pylori (H. pylori) infection    Hypercholesteremia    Hyperthyroidism    Menopausal and female climacteric states    Pap smear abnormality of cervix with ASCUS favoring benign     Past Surgical History:  Procedure Laterality Date   central retinal vein occlusion      Current Medications: Current Meds  Medication Sig   calcium carbonate (TUMS - DOSED IN MG ELEMENTAL CALCIUM) 500 MG chewable tablet Chew 1 tablet by mouth as needed for indigestion or heartburn.   triamcinolone cream (KENALOG) 0.1 % as needed.     Allergies:   Patient has no known allergies.   Social History   Socioeconomic History   Marital status: Single    Spouse name: Not on file   Number of  children: Not on file   Years of education: Not on file   Highest education level: Not on file  Occupational History   Not on file  Tobacco Use   Smoking status: Never   Smokeless tobacco: Never  Substance and Sexual Activity   Alcohol use: No   Drug use: No   Sexual activity: Not on file  Other Topics Concern   Not on file  Social History Narrative   Not on file   Social Determinants of Health   Financial Resource Strain: Not on file  Food Insecurity: Not on file  Transportation Needs: Not on file  Physical Activity: Not on file  Stress: Not on file  Social Connections: Not on file     Family History: The patient's family history includes Cancer in an other family member; Diabetes in her mother and another family member; Hypertension in an other family member; Stroke in her father and another family member.  ROS:   Please see the history of present illness.    Was having palpitations earlier this year that have resolved.  Had COVID-19 in August 2022.  She tried Nexium for heartburn but did not get much improvement.  All other systems reviewed and are negative.  EKGs/Labs/Other Studies Reviewed:    The following studies were reviewed today: No new or recent imaging  EKG:  EKG performed today demonstrates normal sinus rhythm  with normal overall appearance.  Recent Labs: No results found for requested labs within last 8760 hours.  Recent Lipid Panel No results found for: CHOL, TRIG, HDL, CHOLHDL, VLDL, LDLCALC, LDLDIRECT  Physical Exam:    VS:  BP 118/72    Pulse 78    Ht 5\' 9"  (1.753 m)    Wt 186 lb 3.2 oz (84.5 kg)    SpO2 98%    BMI 27.50 kg/m     Wt Readings from Last 3 Encounters:  03/15/21 186 lb 3.2 oz (84.5 kg)  11/14/17 186 lb 3.2 oz (84.5 kg)  04/15/13 184 lb 4 oz (83.6 kg)     GEN: Slightly overweight. No acute distress HEENT: Normal NECK: No JVD. LYMPHATICS: No lymphadenopathy CARDIAC: No murmur. RRR no gallop, or edema. VASCULAR:  Normal  Pulses. No bruits. RESPIRATORY:  Clear to auscultation without rales, wheezing or rhonchi  ABDOMEN: Soft, non-tender, non-distended, No pulsatile mass, MUSCULOSKELETAL: No deformity  SKIN: Warm and dry NEUROLOGIC:  Alert and oriented x 3 PSYCHIATRIC:  Normal affect   ASSESSMENT:    1. Palpitations   2. Hypercholesteremia   3. Family history of early CAD   4. Heartburn    PLAN:    In order of problems listed above:  I asked that she activate the smart watch that she owns and monitor her heart rhythm and rate that way.  If she has an abnormal rhythm she should recording the electrocardiogram and send it for assessment. LDL of 181 is extremely elevated and with increasing age, family history of CAD (sister) it is difficult to allow this elevation without consideration of therapy. Younger sister underwent coronary bypass surgery within the last 2 years.  Her sibling is 54 years old but had surgery at age 87. Heartburn could represent GI complaint.  Rule out CAD.   We decided not to perform a formal monitor but simply to asked that she monitor rate and rhythm on her smart watch.  She can report those to 60.  I would love for her to activate her device to the level of been able to record her rhythm strip.  I recommended that she undergo coronary CT angiography to exclude the possibility that "indigestion" is ischemia related.  The CT angio would also allow [place] to rule out hiatal hernia.  She is reluctant to move forward without discussing this with Dr. [place].  Clearly if she has an elevated calcium score and plaque, her cholesterol needs to be treated.  If her coronaries are normal and calcium score is 0, status quo lipids would be recommended.   Medication Adjustments/Labs and Tests Ordered: Current medicines are reviewed at length with the patient today.  Concerns regarding medicines are outlined above.  Orders Placed This Encounter  Procedures   EKG 12-Lead   No orders of the  defined types were placed in this encounter.   Patient Instructions  Medication Instructions:  Your physician recommends that you continue on your current medications as directed. Please refer to the Current Medication list given to you today.  *If you need a refill on your cardiac medications before your next appointment, please call your pharmacy*   Lab Work: None If you have labs (blood work) drawn today and your tests are completely normal, you will receive your results only by: MyChart Message (if you have MyChart) OR A paper copy in the mail If you have any lab test that is abnormal or we need to change your treatment, we will call  you to review the results.   Testing/Procedures: None   Follow-Up: At [facility], you and your health needs are our priority.  As part of our continuing mission to provide you with exceptional heart care, we have created designated Provider Care Teams.  These Care Teams include your primary Cardiologist (physician) and Advanced Practice Providers (APPs -  Physician Assistants and Nurse Practitioners) who all work together to provide you with the care you need, when you need it.  We recommend signing up for the patient portal called "MyChart".  Sign up information is provided on this After Visit Summary.  MyChart is used to connect with patients for Virtual Visits (Telemedicine).  Patients are able to view lab/test results, encounter notes, upcoming appointments, etc.  Non-urgent messages can be sent to your provider as well.   To learn more about what you can do with MyChart, go to [email or web address].    Your next appointment:   As needed  The format for your next appointment:   In Person  Provider:   Dr, Verdis Prime, III, MD    Other Instructions     Signed, Lesleigh Noe, MD  03/15/2021 5:32 PM    [facility]

## 2021-03-15 NOTE — Patient Instructions (Signed)
Medication Instructions:  Your physician recommends that you continue on your current medications as directed. Please refer to the Current Medication list given to you today.  *If you need a refill on your cardiac medications before your next appointment, please call your pharmacy*   Lab Work: None If you have labs (blood work) drawn today and your tests are completely normal, you will receive your results only by: MyChart Message (if you have MyChart) OR A paper copy in the mail If you have any lab test that is abnormal or we need to change your treatment, we will call you to review the results.   Testing/Procedures: None   Follow-Up: At [facility], you and your health needs are our priority.  As part of our continuing mission to provide you with exceptional heart care, we have created designated Provider Care Teams.  These Care Teams include your primary Cardiologist (physician) and Advanced Practice Providers (APPs -  Physician Assistants and Nurse Practitioners) who all work together to provide you with the care you need, when you need it.  We recommend signing up for the patient portal called "MyChart".  Sign up information is provided on this After Visit Summary.  MyChart is used to connect with patients for Virtual Visits (Telemedicine).  Patients are able to view lab/test results, encounter notes, upcoming appointments, etc.  Non-urgent messages can be sent to your provider as well.   To learn more about what you can do with MyChart, go to [email or web address].    Your next appointment:   As needed  The format for your next appointment:   In Person  Provider:   Dr, Verdis Prime, III, MD    Other Instructions

## 2021-03-15 NOTE — Progress Notes (Signed)
No

## 2021-04-09 ENCOUNTER — Other Ambulatory Visit: Payer: Self-pay | Admitting: Family Medicine

## 2021-04-09 DIAGNOSIS — Z1231 Encounter for screening mammogram for malignant neoplasm of breast: Secondary | ICD-10-CM

## 2021-05-06 ENCOUNTER — Telehealth: Payer: Self-pay | Admitting: Interventional Cardiology

## 2021-05-06 DIAGNOSIS — R072 Precordial pain: Secondary | ICD-10-CM

## 2021-05-06 NOTE — Telephone Encounter (Signed)
Looks like you recommended CTA for this pt and she was reluctant to do it without talking to Dr. Laurann Montana.  Dr. Delene Ruffini office is calling stating they tried to order the test and insurance denied it and they are wanting [place] to try to order it.  Do you want to get a Coronary CT on pt?

## 2021-05-06 NOTE — Telephone Encounter (Signed)
Dr. Jone Baseman Office called. They tried to order a CT Angio for this patient but it ws denied by the patient's insurance. The PCP wanted to know if Dr. Katrinka Blazing could order one with the hope of it being approved by the patien's insurance

## 2021-05-10 ENCOUNTER — Encounter: Payer: Self-pay | Admitting: *Deleted

## 2021-05-10 MED ORDER — METOPROLOL TARTRATE 100 MG PO TABS: ORAL_TABLET | ORAL | 0 refills | Status: AC

## 2021-05-10 NOTE — Telephone Encounter (Signed)
Spoke with pt and reviewed recommendations.  Went over CT instructions and advised that I will put a letter in MyChart for her to review.  Advised to call back with any questions.  Will make lab appt once CT scheduled.

## 2021-05-11 ENCOUNTER — Ambulatory Visit: Payer: BC Managed Care – PPO | Attending: Family

## 2021-05-11 DIAGNOSIS — Z23 Encounter for immunization: Secondary | ICD-10-CM

## 2021-05-11 NOTE — Progress Notes (Signed)
° °  Covid-19 Vaccination Clinic  Name:  Albirta Rhinehart    MRN: [id number] DOB: 1960-11-09  05/11/2021  Ms. Lovins was observed post Covid-19 immunization for 15 minutes without incident. She was provided with Vaccine Information Sheet and instruction to access the V-Safe system.   Ms. Witkop was instructed to call 911 with any severe reactions post vaccine: Difficulty breathing  Swelling of face and throat  A fast heartbeat  A bad rash all over body  Dizziness and weakness   Immunizations Administered     Name Date Dose VIS Date Route   Pfizer Covid-19 Vaccine Bivalent Booster 05/11/2021  9:30 AM 0.3 mL 11/25/2020 Intramuscular   Manufacturer: [employer], [employer]   Lot: [id number]   NDC: [phone]

## 2021-05-12 NOTE — Telephone Encounter (Signed)
Spoke with pt and scheduled lab work for 05/17/21.

## 2021-05-17 ENCOUNTER — Other Ambulatory Visit: Payer: Self-pay

## 2021-05-17 ENCOUNTER — Other Ambulatory Visit: Payer: BC Managed Care – PPO | Admitting: *Deleted

## 2021-05-17 DIAGNOSIS — R072 Precordial pain: Secondary | ICD-10-CM

## 2021-05-17 LAB — BASIC METABOLIC PANEL
BUN/Creatinine Ratio: 21 (ref 9–23)
BUN: 16 mg/dL (ref 6–24)
CO2: 27 mmol/L (ref 20–29)
Calcium: 8.7 mg/dL (ref 8.7–10.2)
Chloride: 100 mmol/L (ref 96–106)
Creatinine, Ser: 0.78 mg/dL (ref 0.57–1.00)
Glucose: 88 mg/dL (ref 70–99)
Potassium: 4.4 mmol/L (ref 3.5–5.2)
Sodium: 140 mmol/L (ref 134–144)
eGFR: 87 mL/min/{1.73_m2} (ref >59)

## 2021-05-19 ENCOUNTER — Encounter (HOSPITAL_COMMUNITY): Payer: Self-pay

## 2021-05-19 ENCOUNTER — Telehealth (HOSPITAL_COMMUNITY): Payer: Self-pay | Admitting: *Deleted

## 2021-05-19 NOTE — Telephone Encounter (Signed)
Reaching out to patient to offer assistance regarding upcoming cardiac imaging study; pt verbalizes understanding of appt date/time, but did not have any instructions.  She also wanted to cancel due to cost.  The scheduler phone number was given to her to call when she was ready. MyChart instructions sent and encouraged patient to obtain metoprolol tablet in the event she decides to pursue her cardiac CT.   Larey Brick RN Navigator Cardiac Imaging [facility] [phone] office [phone] cell

## 2021-05-20 ENCOUNTER — Ambulatory Visit (HOSPITAL_COMMUNITY): Admission: RE | Admit: 2021-05-20 | Payer: BC Managed Care – PPO | Source: Ambulatory Visit

## 2021-05-20 ENCOUNTER — Encounter (HOSPITAL_COMMUNITY): Payer: Self-pay

## 2021-05-20 ENCOUNTER — Ambulatory Visit (HOSPITAL_COMMUNITY): Payer: BC Managed Care – PPO

## 2021-05-26 ENCOUNTER — Ambulatory Visit
Admission: RE | Admit: 2021-05-26 | Discharge: 2021-05-26 | Disposition: A | Discharge status: Home / Self Care | Payer: BC Managed Care – PPO | Source: Ambulatory Visit | Attending: Family Medicine | Admitting: Family Medicine

## 2021-05-26 DIAGNOSIS — Z1231 Encounter for screening mammogram for malignant neoplasm of breast: Secondary | ICD-10-CM

## 2021-08-03 ENCOUNTER — Telehealth: Payer: Self-pay | Admitting: Interventional Cardiology

## 2021-08-03 DIAGNOSIS — Z8249 Family history of ischemic heart disease and other diseases of the circulatory system: Secondary | ICD-10-CM

## 2021-08-03 NOTE — Telephone Encounter (Signed)
? ?  Diana from Dr. Jone Baseman office, she said, pt called the asking them what else she can get since the CT morphe was too expensive and cant afford it. They're calling to see what else can Dr. Katrinka Blazing can recommend for pt. She said just to call back pt for the recommendation  ?

## 2021-08-04 NOTE — Telephone Encounter (Signed)
Treat LDL to achieve level < 70 with statin or other newer therapy. ?Rather than CT angio with morphology, get Calcium Score to stratify risk. If high start coated aspirin 81 mg daily. ?

## 2021-08-04 NOTE — Telephone Encounter (Signed)
Spoke with patient to discuss Dr. Thompson Caul recommendation. ? ?Per Dr. Tamala Julian: ?Treat LDL to achieve level < 70 with statin or other newer therapy. ?Rather than CT angio with morphology, get Calcium Score to stratify risk. If high start coated aspirin 81 mg daily. ? ?Explained to patient that this is an OOP cost of about $99. Patient verbalized understanding and is agreeable to have calcium score performed. ?

## 2021-09-05 ENCOUNTER — Encounter (HOSPITAL_COMMUNITY): Payer: Self-pay

## 2021-09-10 ENCOUNTER — Other Ambulatory Visit: Payer: Self-pay | Admitting: Obstetrics and Gynecology

## 2021-09-10 ENCOUNTER — Other Ambulatory Visit (HOSPITAL_COMMUNITY)
Admission: RE | Admit: 2021-09-10 | Discharge: 2021-09-10 | Disposition: A | Discharge status: Home / Self Care | Payer: BC Managed Care – PPO | Source: Ambulatory Visit | Attending: Obstetrics and Gynecology | Admitting: Obstetrics and Gynecology

## 2021-09-10 DIAGNOSIS — Z01419 Encounter for gynecological examination (general) (routine) without abnormal findings: Secondary | ICD-10-CM | POA: Diagnosis present

## 2021-09-14 LAB — CYTOLOGY - PAP
Comment: NEGATIVE
Diagnosis: UNDETERMINED — AB
High risk HPV: NEGATIVE

## 2021-09-20 ENCOUNTER — Ambulatory Visit (HOSPITAL_BASED_OUTPATIENT_CLINIC_OR_DEPARTMENT_OTHER)
Admission: RE | Admit: 2021-09-20 | Discharge: 2021-09-20 | Disposition: A | Discharge status: Home / Self Care | Payer: BC Managed Care – PPO | Source: Ambulatory Visit | Attending: Interventional Cardiology | Admitting: Interventional Cardiology

## 2021-09-20 DIAGNOSIS — Z8249 Family history of ischemic heart disease and other diseases of the circulatory system: Secondary | ICD-10-CM | POA: Insufficient documentation

## 2021-10-08 ENCOUNTER — Inpatient Hospital Stay: Admission: RE | Admit: 2021-10-08 | Payer: BC Managed Care – PPO | Source: Ambulatory Visit

## 2022-03-01 ENCOUNTER — Other Ambulatory Visit: Payer: Self-pay | Admitting: Internal Medicine

## 2022-03-01 DIAGNOSIS — Z1231 Encounter for screening mammogram for malignant neoplasm of breast: Secondary | ICD-10-CM

## 2022-04-21 IMAGING — MG MM DIGITAL SCREENING BILAT W/ TOMO AND CAD
8 series · 9 of 24 positions shown · non-contrast
Comparison: Previous exam(s).

CLINICAL DATA: Screening. History of cysts.

EXAM:
DIGITAL SCREENING BILATERAL MAMMOGRAM WITH TOMOSYNTHESIS AND CAD
TECHNIQUE: Bilateral screening digital craniocaudal and mediolateral oblique
mammograms were obtained. Bilateral screening digital breast
tomosynthesis was performed. The images were evaluated with
computer-aided detection.

[R CC synth-2D]
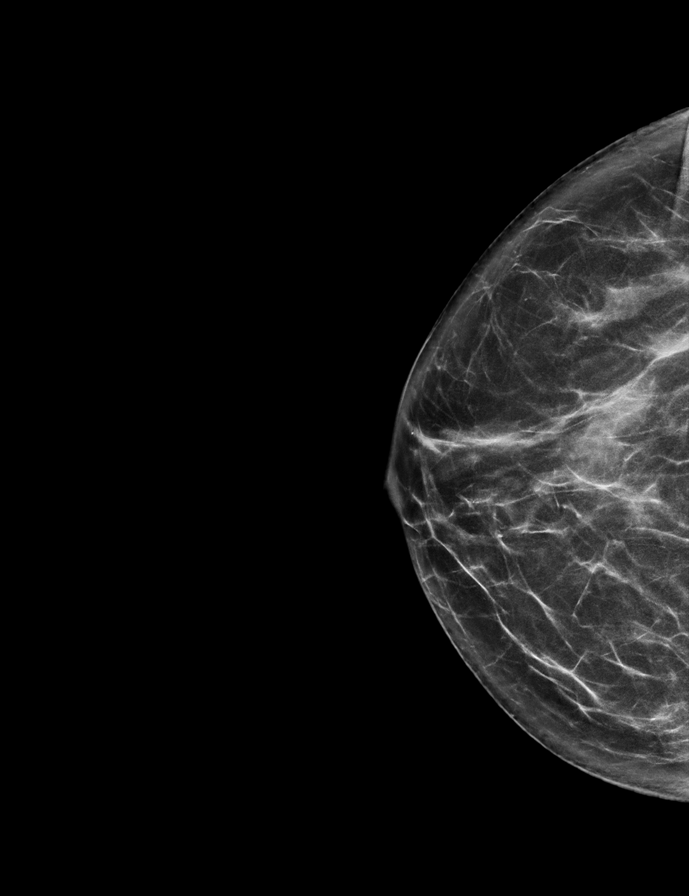

[L CC synth-2D]
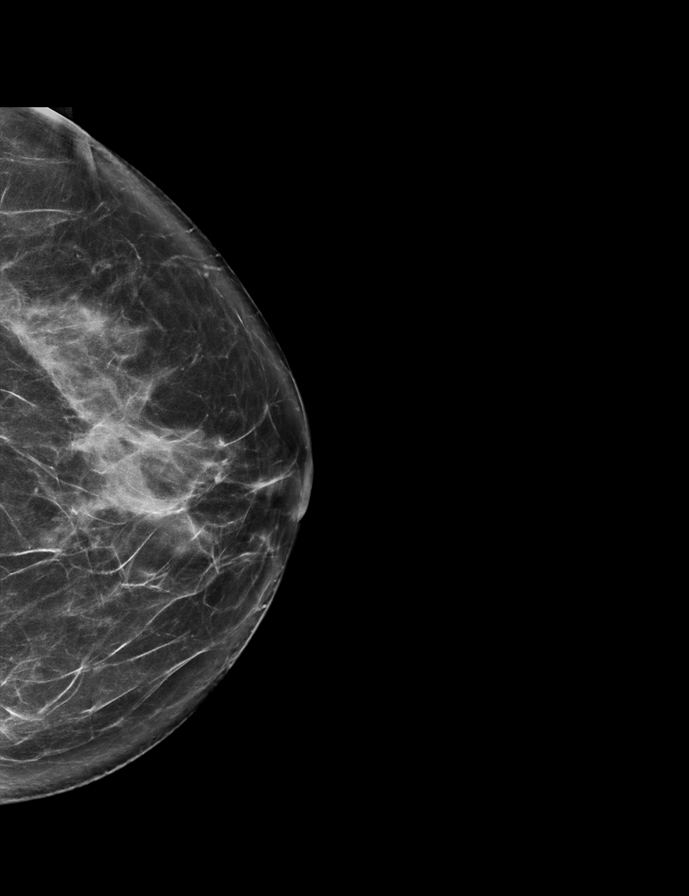

[L MLO synth-2D]
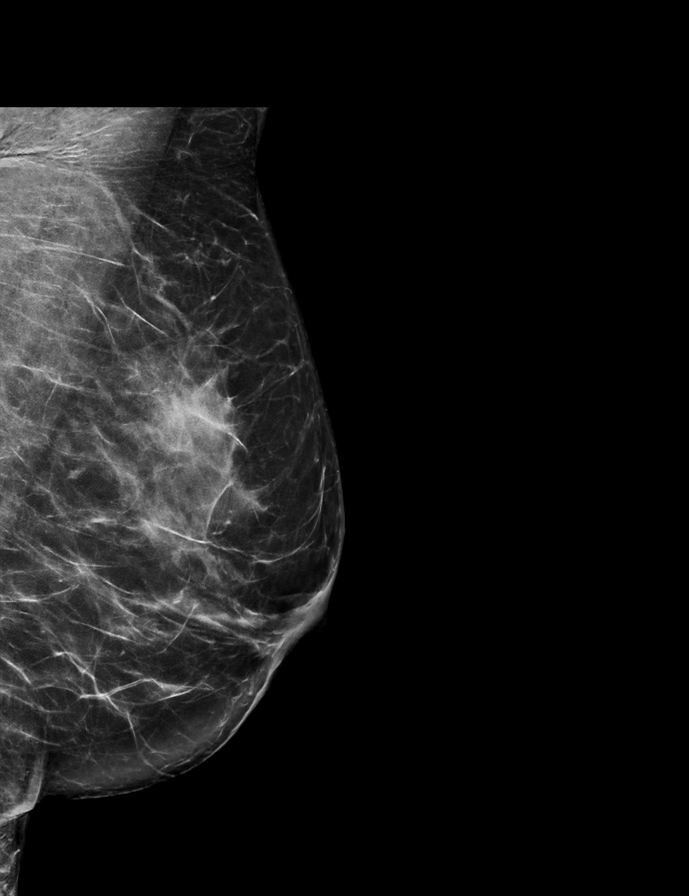

[R MLO synth-2D]
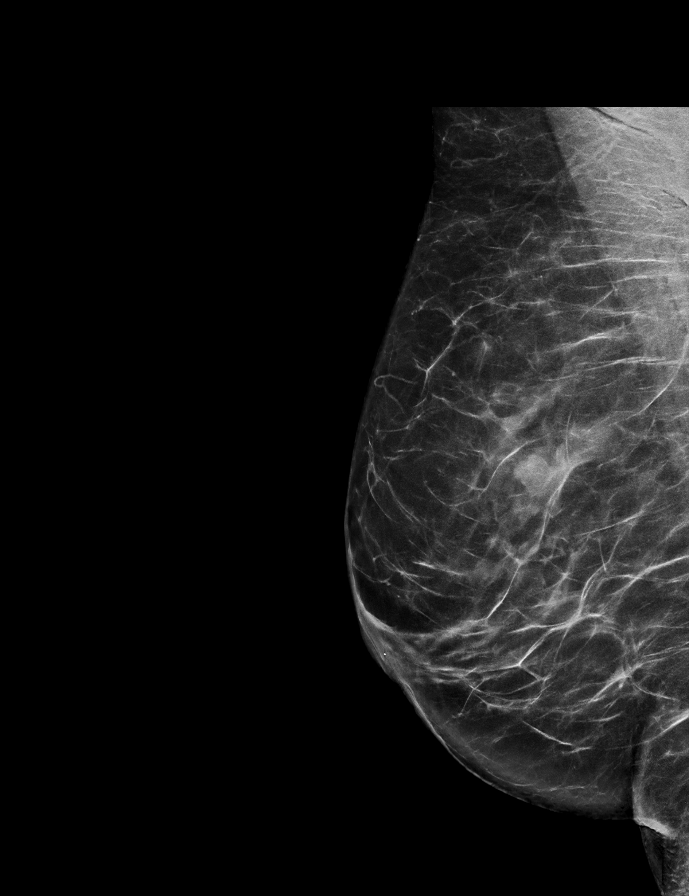

[R CC tomo · 2 of 65 frames shown]
[frame 21/65]
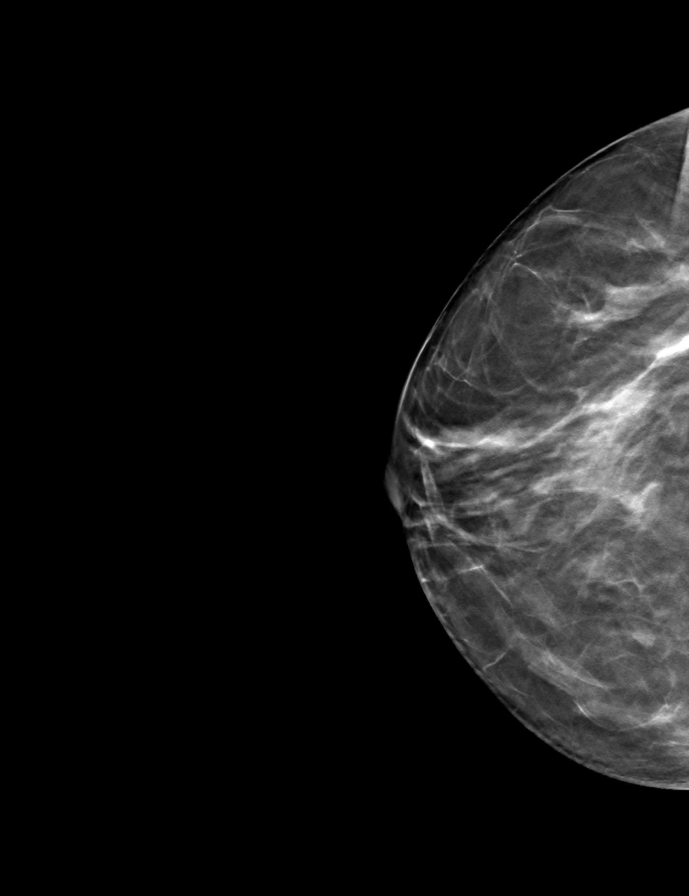
[frame 33/65]
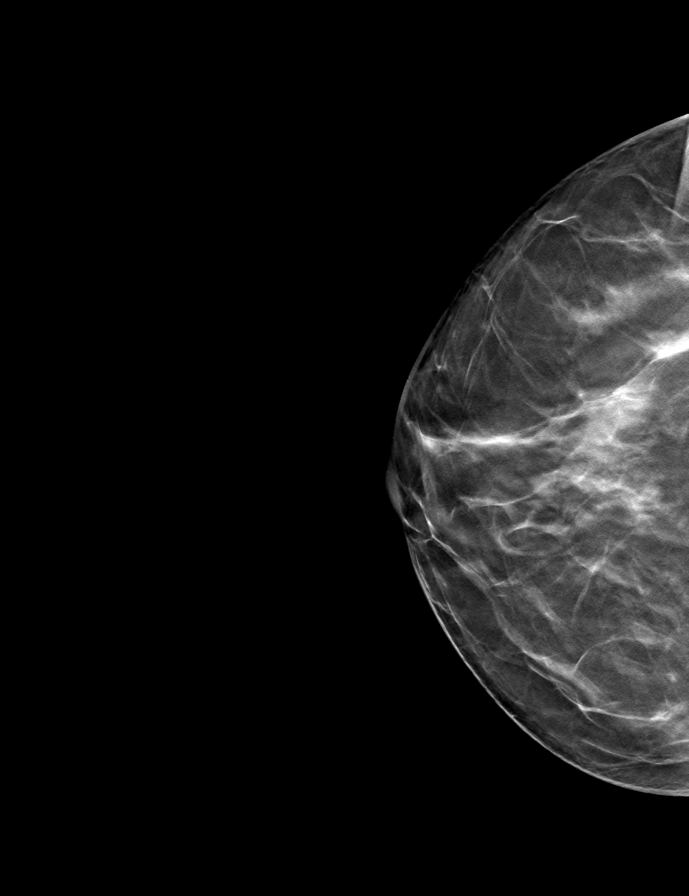

[R MLO tomo · tomo slice 36/71.0]
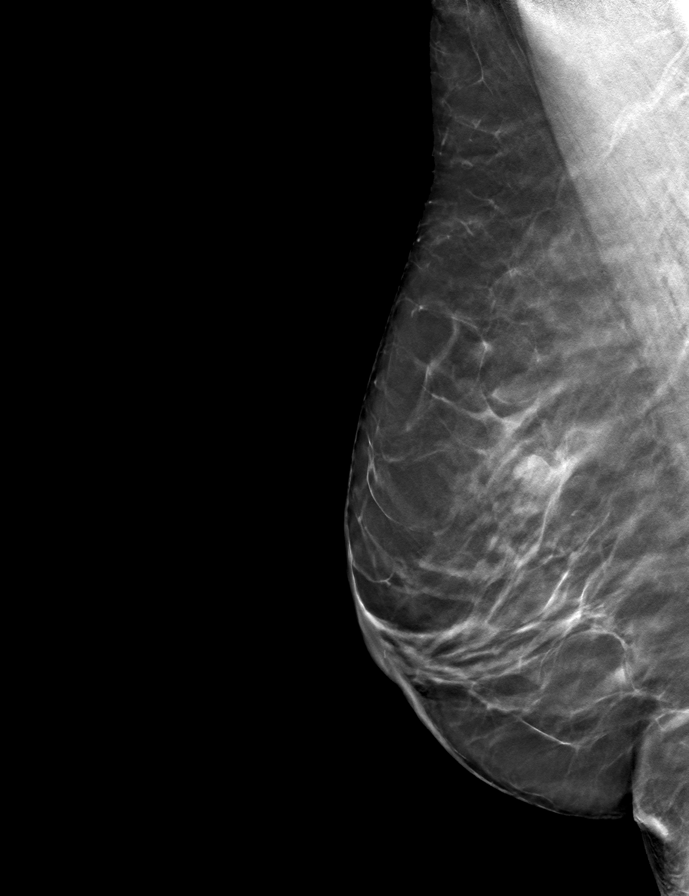

[L CC tomo · tomo slice 36/71.0]
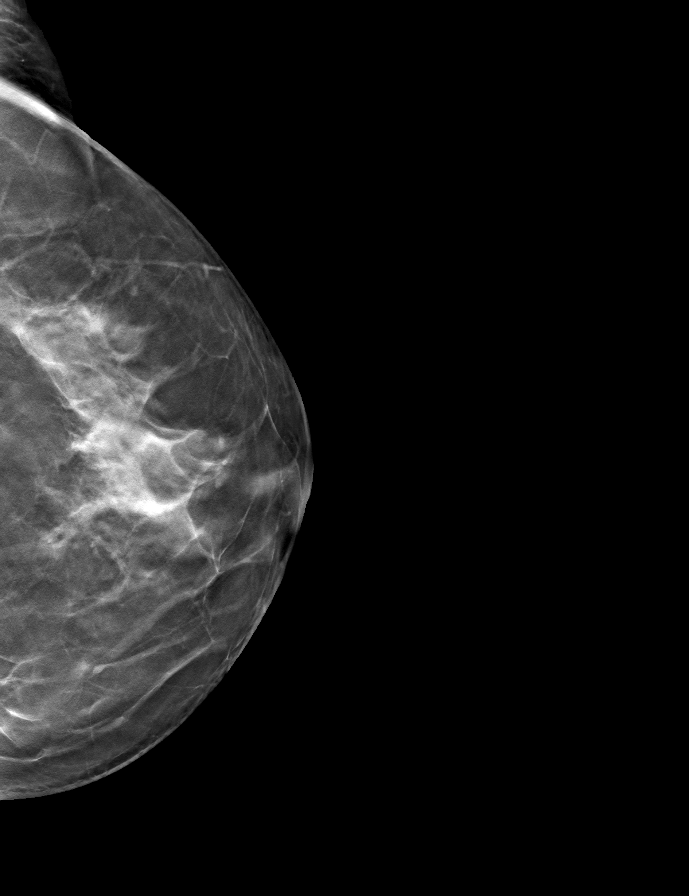

[L MLO tomo · tomo slice 35/69.0]
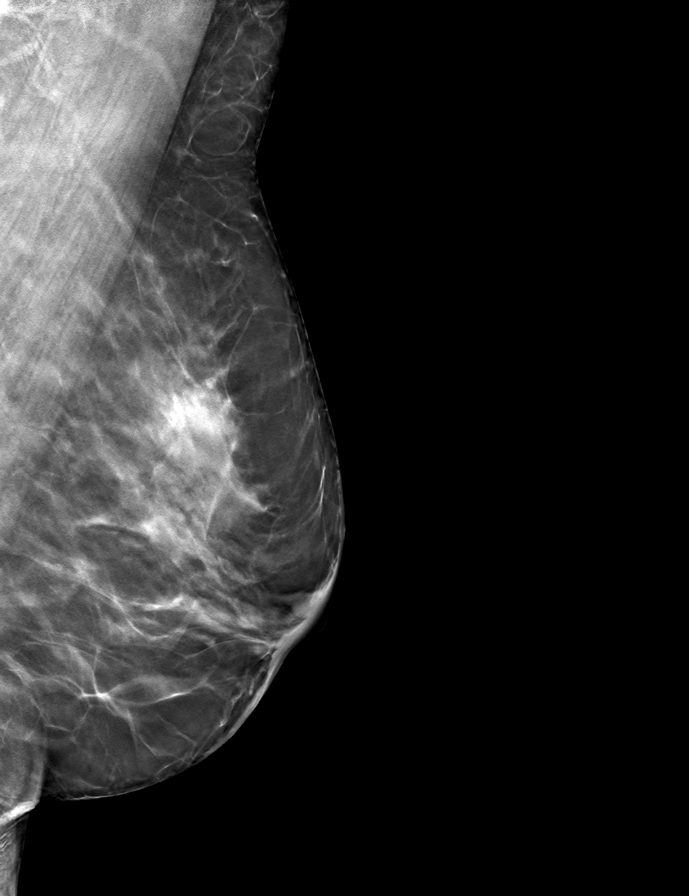

[9 of 24 positions shown; findings below may reference images not displayed]

ACR Breast Density Category c: The breast tissue is heterogeneously
dense, which may obscure small masses.
FINDINGS: There are no findings suspicious for malignancy. There are multiple
round and oval masses which have waxed and waned since prior exam.
These are most consistent with benign cysts.
IMPRESSION: No mammographic evidence of malignancy. A result letter of this
screening mammogram will be mailed directly to the patient.

RECOMMENDATION:
Screening mammogram in one year. (Code:[id number]-[id number])

BI-RADS CATEGORY  2: Benign.

## 2022-05-30 ENCOUNTER — Ambulatory Visit
Admission: RE | Admit: 2022-05-30 | Discharge: 2022-05-30 | Disposition: A | Discharge status: Home / Self Care | Payer: BC Managed Care – PPO | Source: Ambulatory Visit | Attending: Internal Medicine | Admitting: Internal Medicine

## 2022-05-30 DIAGNOSIS — Z1231 Encounter for screening mammogram for malignant neoplasm of breast: Secondary | ICD-10-CM

## 2022-09-16 ENCOUNTER — Other Ambulatory Visit (HOSPITAL_COMMUNITY)
Admission: RE | Admit: 2022-09-16 | Discharge: 2022-09-16 | Disposition: A | Discharge status: Home / Self Care | Payer: BC Managed Care – PPO | Source: Ambulatory Visit | Attending: Nurse Practitioner | Admitting: Nurse Practitioner

## 2022-09-16 ENCOUNTER — Other Ambulatory Visit: Payer: Self-pay | Admitting: Nurse Practitioner

## 2022-09-16 DIAGNOSIS — Z124 Encounter for screening for malignant neoplasm of cervix: Secondary | ICD-10-CM | POA: Diagnosis present

## 2022-09-20 LAB — CYTOLOGY - PAP
Comment: NEGATIVE
Diagnosis: UNDETERMINED — AB
High risk HPV: NEGATIVE

## 2022-11-21 ENCOUNTER — Other Ambulatory Visit: Payer: Self-pay | Admitting: Internal Medicine

## 2022-11-21 DIAGNOSIS — R911 Solitary pulmonary nodule: Secondary | ICD-10-CM

## 2022-12-12 ENCOUNTER — Ambulatory Visit
Admission: RE | Admit: 2022-12-12 | Discharge: 2022-12-12 | Disposition: A | Discharge status: Home / Self Care | Payer: BC Managed Care – PPO | Source: Ambulatory Visit | Attending: Internal Medicine

## 2022-12-12 DIAGNOSIS — R911 Solitary pulmonary nodule: Secondary | ICD-10-CM

## 2023-03-08 ENCOUNTER — Other Ambulatory Visit: Payer: Self-pay | Admitting: Internal Medicine

## 2023-03-08 DIAGNOSIS — R9389 Abnormal findings on diagnostic imaging of other specified body structures: Secondary | ICD-10-CM

## 2023-03-14 ENCOUNTER — Other Ambulatory Visit: Payer: BC Managed Care – PPO

## 2023-03-16 ENCOUNTER — Ambulatory Visit
Admission: RE | Admit: 2023-03-16 | Discharge: 2023-03-16 | Disposition: A | Discharge status: Home / Self Care | Payer: BC Managed Care – PPO | Source: Ambulatory Visit | Attending: Internal Medicine | Admitting: Internal Medicine

## 2023-03-16 DIAGNOSIS — R9389 Abnormal findings on diagnostic imaging of other specified body structures: Secondary | ICD-10-CM

## 2023-04-19 ENCOUNTER — Other Ambulatory Visit: Payer: Self-pay | Admitting: Internal Medicine

## 2023-04-19 DIAGNOSIS — Z1231 Encounter for screening mammogram for malignant neoplasm of breast: Secondary | ICD-10-CM

## 2023-06-02 ENCOUNTER — Ambulatory Visit
Admission: RE | Admit: 2023-06-02 | Discharge: 2023-06-02 | Disposition: A | Discharge status: Home / Self Care | Payer: Self-pay | Source: Ambulatory Visit | Attending: Internal Medicine | Admitting: Internal Medicine

## 2023-06-02 DIAGNOSIS — Z1231 Encounter for screening mammogram for malignant neoplasm of breast: Secondary | ICD-10-CM

## 2024-01-15 ENCOUNTER — Other Ambulatory Visit: Payer: Self-pay | Admitting: Internal Medicine

## 2024-01-15 DIAGNOSIS — Z1231 Encounter for screening mammogram for malignant neoplasm of breast: Secondary | ICD-10-CM

## 2024-06-03 ENCOUNTER — Ambulatory Visit

## 2024-06-07 ENCOUNTER — Ambulatory Visit: Payer: Self-pay
